# Patient Record
Sex: Female | Born: 2000 | Hispanic: No | Marital: Single | Smoking: Never smoker
Health system: Southern US, Community
[De-identification: ages and names within clinical notes are randomized; demographics above are authoritative.]

## PROBLEM LIST (undated history)

## (undated) DIAGNOSIS — J45909 Unspecified asthma, uncomplicated: Secondary | ICD-10-CM

## (undated) DIAGNOSIS — I10 Essential (primary) hypertension: Secondary | ICD-10-CM

---
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
## 2019-09-26 ENCOUNTER — Ambulatory Visit: Payer: BC Managed Care – PPO | Attending: Family

## 2019-09-26 DIAGNOSIS — Z23 Encounter for immunization: Secondary | ICD-10-CM

## 2019-09-26 NOTE — Progress Notes (Signed)
   Covid-19 Vaccination Clinic  Name:  LATISHA LASCH    MRN: [id number] DOB: 03-10-2001  09/26/2019  Ms. Nierman was observed post Covid-19 immunization for 15 minutes without incident. She was provided with Vaccine Information Sheet and instruction to access the V-Safe system.   Ms. Carneal was instructed to call 911 with any severe reactions post vaccine: Marland Kitchen Difficulty breathing  . Swelling of face and throat  . A fast heartbeat  . A bad rash all over body  . Dizziness and weakness   Immunizations Administered    Name Date Dose VIS Date Route   Moderna COVID-19 Vaccine 09/26/2019  4:14 PM 0.5 mL 06/18/2019 Intramuscular   Manufacturer: Moderna   Lot: [id number]   NDC: 54301-484-03

## 2019-10-29 ENCOUNTER — Ambulatory Visit: Payer: BC Managed Care – PPO | Attending: Family

## 2019-10-29 ENCOUNTER — Ambulatory Visit: Payer: BC Managed Care – PPO

## 2019-10-29 DIAGNOSIS — Z23 Encounter for immunization: Secondary | ICD-10-CM

## 2019-10-29 NOTE — Progress Notes (Signed)
   Covid-19 Vaccination Clinic  Name:  Wanda Drake    MRN: [id number] DOB: 01-20-2001  10/29/2019  [name] was observed post Covid-19 immunization for 15 minutes without incident. She was provided with Vaccine Information Sheet and instruction to access the V-Safe system.   [name] was instructed to call 911 with any severe reactions post vaccine: Marland Kitchen Difficulty breathing  . Swelling of face and throat  . A fast heartbeat  . A bad rash all over body  . Dizziness and weakness   Immunizations Administered    Name Date Dose VIS Date Route   Moderna COVID-19 Vaccine 10/29/2019  2:23 PM 0.5 mL 06/18/2019 Intramuscular   Manufacturer: Moderna   Lot: [id number]   NDC: 77414-239-53

## 2020-05-24 ENCOUNTER — Other Ambulatory Visit: Payer: Self-pay

## 2020-05-24 ENCOUNTER — Encounter: Payer: Self-pay | Admitting: Emergency Medicine

## 2020-05-24 ENCOUNTER — Ambulatory Visit
Admission: EM | Admit: 2020-05-24 | Discharge: 2020-05-24 | Disposition: A | Discharge status: Home / Self Care | Payer: BC Managed Care – PPO | Attending: Family Medicine | Admitting: Family Medicine

## 2020-05-24 DIAGNOSIS — S39012A Strain of muscle, fascia and tendon of lower back, initial encounter: Secondary | ICD-10-CM | POA: Diagnosis not present
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
## 2020-05-24 NOTE — Discharge Instructions (Signed)
Use over-the-counter ibuprofen as needed for muscle pain.  Follow the back exercises and do the range of motion exercises we discussed.  Increase your water intake so that you help flush the lactic acid from your muscles that are causing the pain.  Massage can also be beneficial to help the muscles heal and decrease pain.  If your symptoms continue follow-up with your primary care provider.
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
## 2021-01-25 IMAGING — DX DG ANKLE COMPLETE 3+V*R*
3 series · 3 of 3 positions shown · non-contrast
Comparison: None.

CLINICAL DATA: Fall

EXAM:
RIGHT ANKLE - COMPLETE 3+ VIEW

[ankle ap]
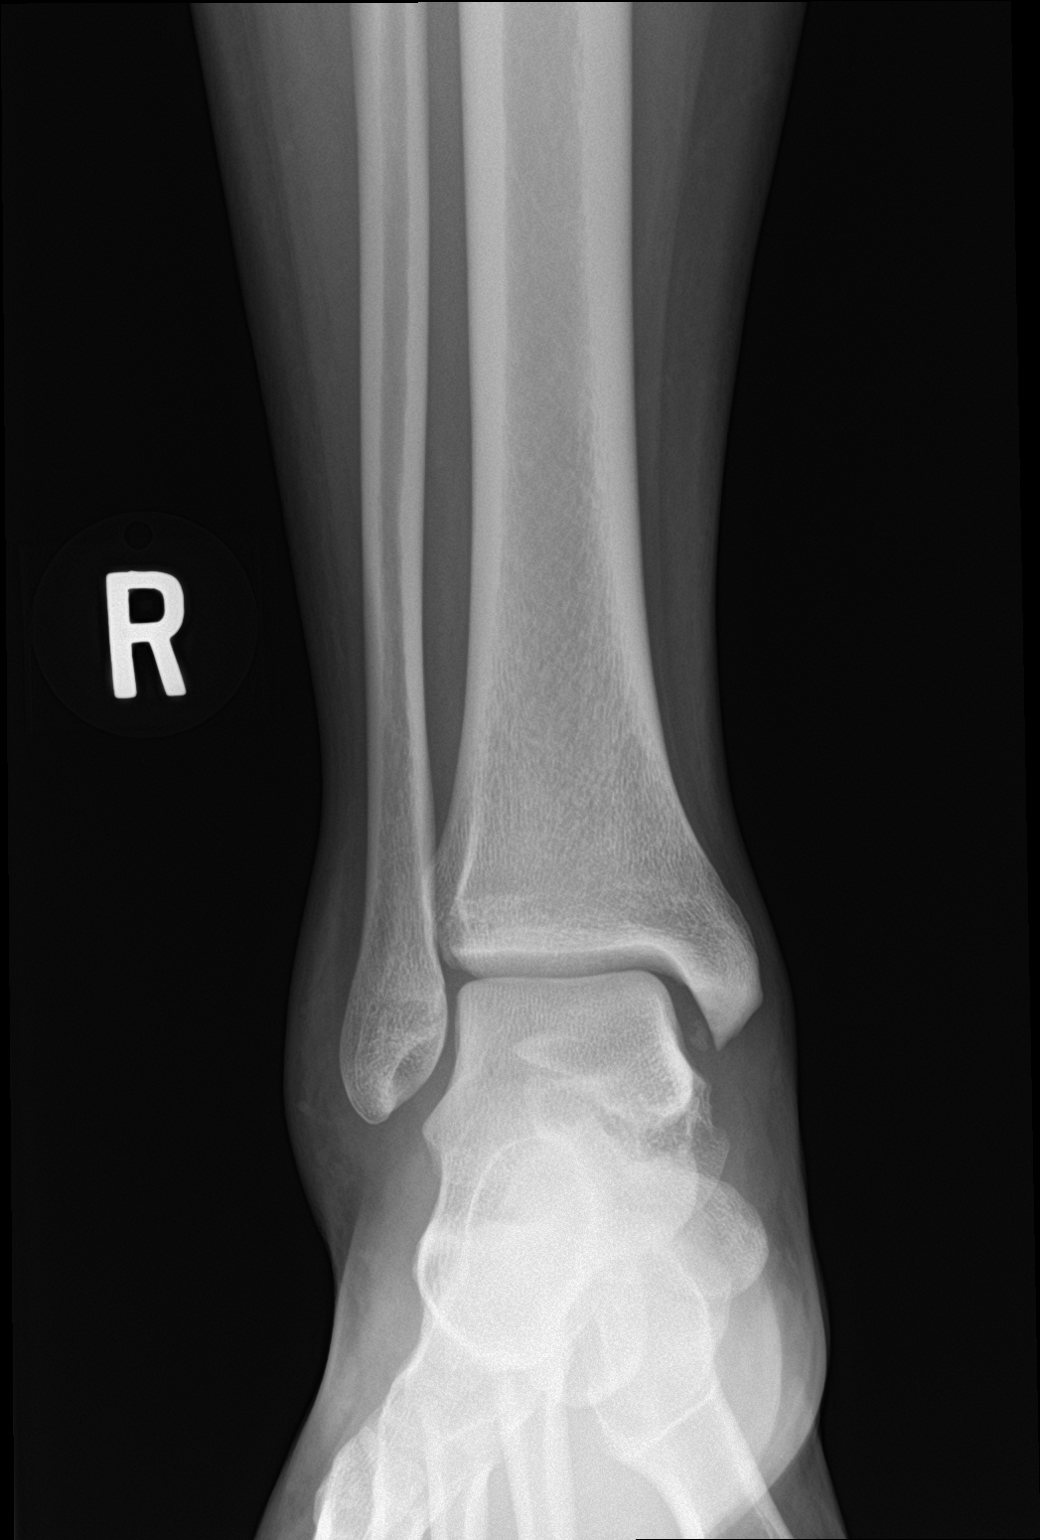

[ankle obl]
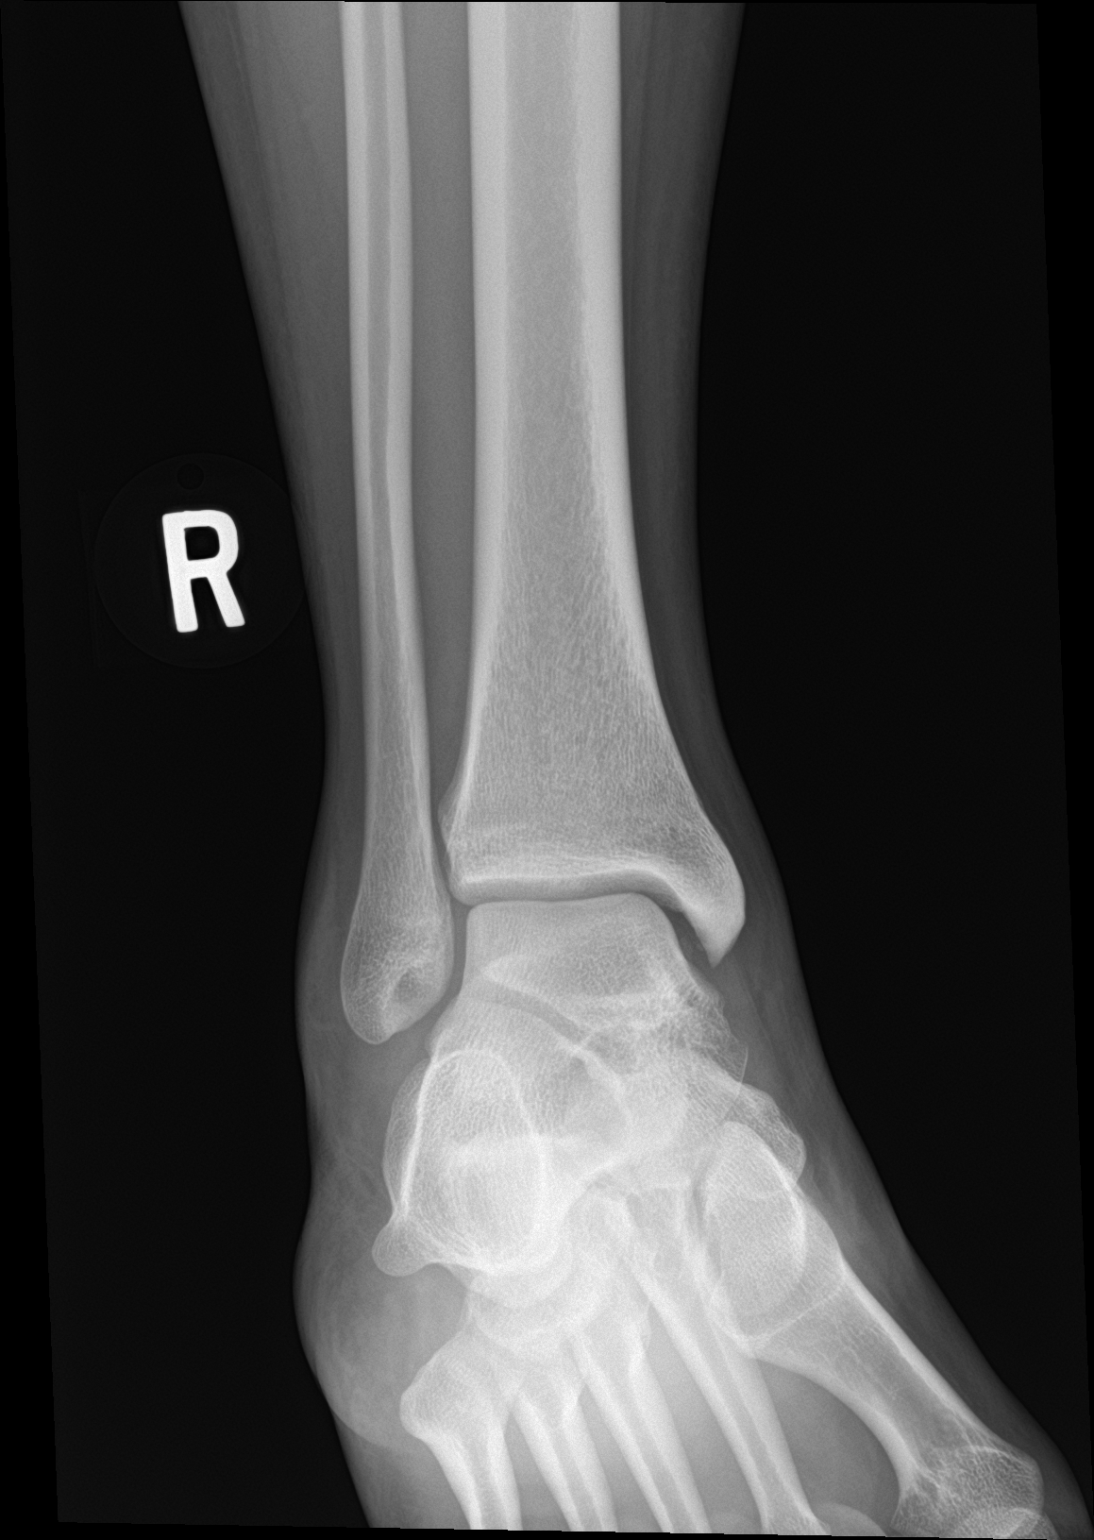

[ankle lat]
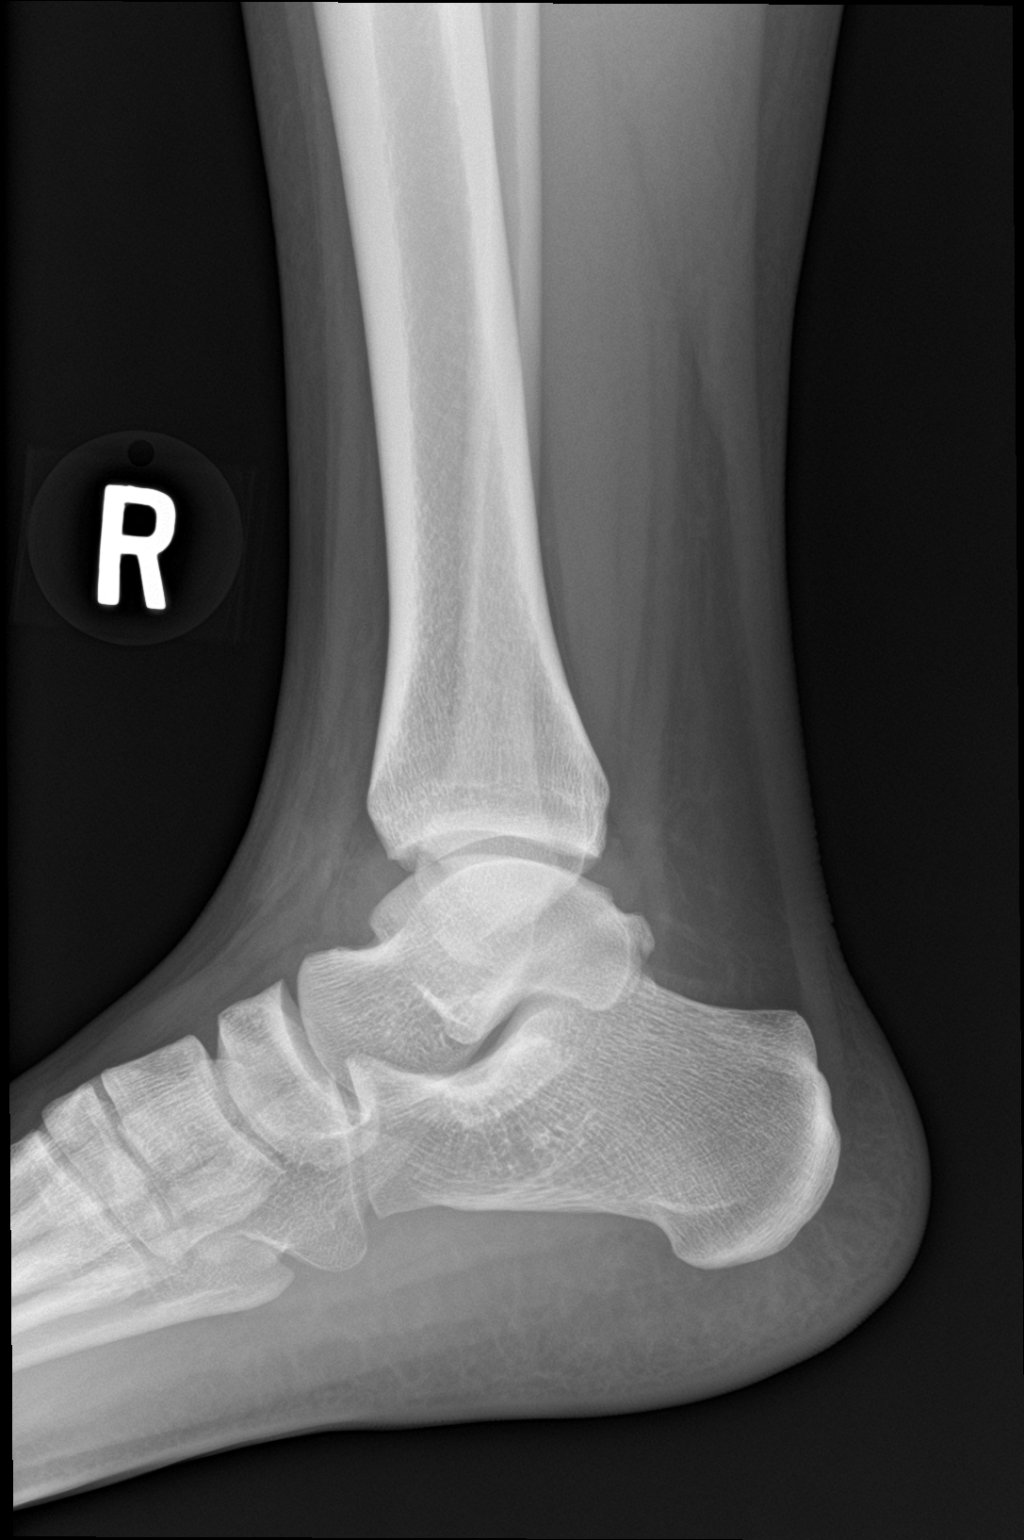

[3 of 3 positions shown; findings below may reference images not displayed]

FINDINGS: No acute fracture. No dislocation. Soft tissue swelling over the
lateral malleolus.
IMPRESSION: No acute bony pathology. Soft tissue swelling over the lateral
malleolus is noted

## 2022-03-06 ENCOUNTER — Emergency Department (HOSPITAL_COMMUNITY): Payer: BC Managed Care – PPO

## 2022-03-06 ENCOUNTER — Encounter (HOSPITAL_COMMUNITY): Payer: Self-pay | Admitting: Emergency Medicine

## 2022-03-06 ENCOUNTER — Other Ambulatory Visit: Payer: Self-pay

## 2022-03-06 ENCOUNTER — Emergency Department (HOSPITAL_COMMUNITY)
Admission: EM | Admit: 2022-03-06 | Discharge: 2022-03-06 | Discharge status: Against Medical Advice | Payer: BC Managed Care – PPO | Attending: Emergency Medicine | Admitting: Emergency Medicine

## 2022-03-06 DIAGNOSIS — Z79899 Other long term (current) drug therapy: Secondary | ICD-10-CM | POA: Insufficient documentation

## 2022-03-06 DIAGNOSIS — J45909 Unspecified asthma, uncomplicated: Secondary | ICD-10-CM | POA: Diagnosis not present

## 2022-03-06 DIAGNOSIS — R519 Headache, unspecified: Secondary | ICD-10-CM | POA: Insufficient documentation

## 2022-03-06 DIAGNOSIS — M542 Cervicalgia: Secondary | ICD-10-CM | POA: Diagnosis not present

## 2022-03-06 DIAGNOSIS — I10 Essential (primary) hypertension: Secondary | ICD-10-CM | POA: Diagnosis not present

## 2022-03-06 DIAGNOSIS — Y92410 Unspecified street and highway as the place of occurrence of the external cause: Secondary | ICD-10-CM | POA: Diagnosis not present

## 2022-03-06 DIAGNOSIS — R079 Chest pain, unspecified: Secondary | ICD-10-CM | POA: Diagnosis not present

## 2022-03-06 DIAGNOSIS — Z7951 Long term (current) use of inhaled steroids: Secondary | ICD-10-CM | POA: Insufficient documentation

## 2022-03-06 LAB — CBC WITH DIFFERENTIAL/PLATELET
Abs Immature Granulocytes: 0.01 10*3/uL (ref 0.00–0.07)
Basophils Absolute: 0 10*3/uL (ref 0.0–0.1)
Basophils Relative: 0 %
Eosinophils Absolute: 0.1 10*3/uL (ref 0.0–0.5)
Eosinophils Relative: 1 %
HCT: 42.6 % (ref 36.0–46.0)
Hemoglobin: 13.9 g/dL (ref 12.0–15.0)
Immature Granulocytes: 0 %
Lymphocytes Relative: 36 %
Lymphs Abs: 2.2 10*3/uL (ref 0.7–4.0)
MCH: 28.3 pg (ref 26.0–34.0)
MCHC: 32.6 g/dL (ref 30.0–36.0)
MCV: 86.6 fL (ref 80.0–100.0)
Monocytes Absolute: 0.7 10*3/uL (ref 0.1–1.0)
Monocytes Relative: 11 %
Neutro Abs: 3.2 10*3/uL (ref 1.7–7.7)
Neutrophils Relative %: 52 %
Platelets: 260 10*3/uL (ref 150–400)
RBC: 4.92 MIL/uL (ref 3.87–5.11)
RDW: 14.1 % (ref 11.5–15.5)
WBC: 6.2 10*3/uL (ref 4.0–10.5)
nRBC: 0 % (ref 0.0–0.2)

## 2022-03-06 LAB — COMPREHENSIVE METABOLIC PANEL
ALT: 18 U/L (ref 0–44)
AST: 22 U/L (ref 15–41)
Albumin: 3.5 g/dL (ref 3.5–5.0)
Alkaline Phosphatase: 41 U/L (ref 38–126)
Anion gap: 8 (ref 5–15)
BUN: <5 mg/dL — ABNORMAL LOW (ref 6–20)
CO2: 23 mmol/L (ref 22–32)
Calcium: 8.6 mg/dL — ABNORMAL LOW (ref 8.9–10.3)
Chloride: 107 mmol/L (ref 98–111)
Creatinine, Ser: 0.85 mg/dL (ref 0.44–1.00)
GFR, Estimated: >60 mL/min (ref >60)
Glucose, Bld: 111 mg/dL — ABNORMAL HIGH (ref 70–99)
Potassium: 3.8 mmol/L (ref 3.5–5.1)
Sodium: 138 mmol/L (ref 135–145)
Total Bilirubin: 1 mg/dL (ref 0.3–1.2)
Total Protein: 6.2 g/dL — ABNORMAL LOW (ref 6.5–8.1)

## 2022-03-06 LAB — LACTIC ACID, PLASMA: Lactic Acid, Venous: 1 mmol/L (ref 0.5–1.9)

## 2022-03-06 LAB — MAGNESIUM: Magnesium: 2.1 mg/dL (ref 1.7–2.4)

## 2022-03-06 LAB — I-STAT BETA HCG BLOOD, ED (MC, WL, AP ONLY): I-stat hCG, quantitative: <5 m[IU]/mL (ref <5)

## 2022-03-06 MED ORDER — METHOCARBAMOL 500 MG PO TABS
750.0000 mg | ORAL_TABLET | Freq: Once | ORAL | Status: AC
  Administered 2022-03-06: 750 mg via ORAL
  Filled 2022-03-06: qty 2

## 2022-03-06 MED ORDER — METHOCARBAMOL 500 MG PO TABS: 500.0000 mg | ORAL_TABLET | Freq: Three times a day (TID) | ORAL | 0 refills | Status: AC | PRN

## 2022-03-06 MED ORDER — OXYCODONE-ACETAMINOPHEN 5-325 MG PO TABS
1.0000 | ORAL_TABLET | Freq: Once | ORAL | Status: AC
  Administered 2022-03-06: 1 via ORAL
  Filled 2022-03-06: qty 1

## 2022-03-06 MED ORDER — KETOROLAC TROMETHAMINE 60 MG/2ML IM SOLN
30.0000 mg | Freq: Once | INTRAMUSCULAR | Status: AC
  Administered 2022-03-06: 30 mg via INTRAMUSCULAR
  Filled 2022-03-06: qty 2
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
## 2022-03-06 NOTE — ED Notes (Signed)
Patient transported to CT 

## 2022-03-06 NOTE — Discharge Instructions (Signed)
Treat pain and soreness with ibuprofen and Tylenol.  There was also a prescription for muscle relaxer that was sent to your pharmacy.  Your symptoms should improve over the next several days.  Return to the emergency department for any worsening.
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
## 2022-03-12 ENCOUNTER — Emergency Department (HOSPITAL_COMMUNITY)
Admission: EM | Admit: 2022-03-12 | Discharge: 2022-03-12 | Discharge status: Against Medical Advice | Payer: BC Managed Care – PPO | Attending: Emergency Medicine | Admitting: Emergency Medicine

## 2022-03-12 ENCOUNTER — Other Ambulatory Visit: Payer: Self-pay

## 2022-03-12 DIAGNOSIS — Z5321 Procedure and treatment not carried out due to patient leaving prior to being seen by health care provider: Secondary | ICD-10-CM | POA: Insufficient documentation

## 2022-03-12 DIAGNOSIS — M25511 Pain in right shoulder: Secondary | ICD-10-CM | POA: Insufficient documentation
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
## 2022-03-12 NOTE — ED Notes (Signed)
PT decided to leave AMA °

## 2023-02-23 ENCOUNTER — Ambulatory Visit: Payer: Self-pay | Admitting: Gastroenterology
